# Patient Record
Sex: Female | Born: 1960 | Race: Black or African American | Hispanic: No | Marital: Married | Smoking: Never smoker
Health system: Southern US, Community
[De-identification: ages and names within clinical notes are randomized; demographics above are authoritative.]

## PROBLEM LIST (undated history)

## (undated) DIAGNOSIS — C801 Malignant (primary) neoplasm, unspecified: Secondary | ICD-10-CM

## (undated) HISTORY — PX: TUBAL LIGATION: SHX77

## (undated) HISTORY — PX: LEG SURGERY: SHX1003

## (undated) HISTORY — DX: Malignant (primary) neoplasm, unspecified: C80.1

---

## 1998-11-25 ENCOUNTER — Ambulatory Visit (HOSPITAL_COMMUNITY): Admission: RE | Admit: 1998-11-25 | Discharge: 1998-11-25 | Payer: Self-pay | Admitting: Obstetrics

## 2000-09-23 ENCOUNTER — Other Ambulatory Visit: Admission: RE | Admit: 2000-09-23 | Discharge: 2000-09-23 | Payer: Self-pay | Admitting: Obstetrics

## 2002-06-02 ENCOUNTER — Encounter: Payer: Self-pay | Admitting: Obstetrics

## 2002-06-02 ENCOUNTER — Ambulatory Visit (HOSPITAL_COMMUNITY): Admission: RE | Admit: 2002-06-02 | Discharge: 2002-06-02 | Payer: Self-pay | Admitting: Obstetrics

## 2002-06-19 ENCOUNTER — Encounter: Payer: Self-pay | Admitting: Obstetrics

## 2002-06-19 ENCOUNTER — Encounter: Admission: RE | Admit: 2002-06-19 | Discharge: 2002-06-19 | Payer: Self-pay | Admitting: Obstetrics

## 2003-11-03 ENCOUNTER — Ambulatory Visit (HOSPITAL_COMMUNITY): Admission: RE | Admit: 2003-11-03 | Discharge: 2003-11-03 | Payer: Self-pay | Admitting: Obstetrics

## 2005-01-12 ENCOUNTER — Ambulatory Visit (HOSPITAL_COMMUNITY): Admission: RE | Admit: 2005-01-12 | Discharge: 2005-01-12 | Payer: Self-pay | Admitting: Obstetrics

## 2006-04-10 ENCOUNTER — Ambulatory Visit (HOSPITAL_COMMUNITY): Admission: RE | Admit: 2006-04-10 | Discharge: 2006-04-10 | Payer: Self-pay | Admitting: Obstetrics

## 2007-04-16 ENCOUNTER — Ambulatory Visit (HOSPITAL_COMMUNITY): Admission: RE | Admit: 2007-04-16 | Discharge: 2007-04-16 | Payer: Self-pay | Admitting: Obstetrics

## 2008-05-25 ENCOUNTER — Ambulatory Visit (HOSPITAL_COMMUNITY): Admission: RE | Admit: 2008-05-25 | Discharge: 2008-05-25 | Payer: Self-pay | Admitting: Obstetrics

## 2014-03-15 ENCOUNTER — Ambulatory Visit (INDEPENDENT_AMBULATORY_CARE_PROVIDER_SITE_OTHER): Payer: 59 | Admitting: Internal Medicine

## 2014-03-15 VITALS — BP 122/70 | HR 79 | Temp 98.0°F | Resp 16 | Ht 63.0 in | Wt 157.8 lb

## 2014-03-15 DIAGNOSIS — G5700 Lesion of sciatic nerve, unspecified lower limb: Secondary | ICD-10-CM

## 2014-03-15 DIAGNOSIS — S46819A Strain of other muscles, fascia and tendons at shoulder and upper arm level, unspecified arm, initial encounter: Secondary | ICD-10-CM

## 2014-03-15 MED ORDER — MELOXICAM 15 MG PO TABS
15.0000 mg | ORAL_TABLET | Freq: Every day | ORAL | Status: AC
Start: 2014-03-15 — End: ?

## 2014-03-15 MED ORDER — CYCLOBENZAPRINE HCL 10 MG PO TABS: 10.0000 mg | ORAL_TABLET | Freq: Every day | ORAL | Status: AC

## 2014-03-15 NOTE — Patient Instructions (Signed)
Trapezius Palsy  with Rehab The trapezius is a large muscle of the upper back that helps to control the shoulder blade (scapula) and stabilize the spine. Trapezius palsy is a condition affecting the nervous system in the trapezius muscle. The condition results in pain and weakness in the back of the shoulder and upper back. Shoulder function is also decreased, because the scapula contains the socket for "ball and-socket" joint of the shoulder. Trapezius palsy is caused by injury to the spinal accessory nerve, which connects to the trapezius muscle. SYMPTOMS   Pain that is achy and in the shoulder and/or upper back.  Decreased shoulder function and/ or strength.  Scapula protrudes (winging of the scapula).  Back pain when sitting in a chair with a hard back due to the scapula winging and placing pressure on the back of the chair.  Shrinkage (atrophy) of the trapezius muscle, this may or may not make the neckline look asymmetric.  Shoulder drooping. CAUSES  Trapezius palsy is caused by damage to the spinal accessory nerve, which is connected to the trapesius muscle. This condition is often associated with acromioclavicular [facility]) or sternoclavicular subluxation (adjacent bones becoming out of proper alignment, but the joint surfaces are still touching). Common mechanisms of injury include:  Direct trauma to the shoulder (like being hit with an object or falling on the shoulder).  Complication of previous surgery that causes nerve damage. RISK INCREASES WITH:  Contact sports (ie. football, rugby, or lacrosse).  Surgery around the neck.  Poor strength and flexibility. PREVENTION   Warm up and stretch properly before activity.  Maintain physical fitness:  Strength, flexibility, and endurance.  Cardiovascular fitness. PROGNOSIS  Trapezius palsy usually resolves spontaneously within 3 to 6 months. Non-surgical (conservative) treatments may help decrease the severity of symptoms.    RELATED COMPLICATIONS   Permanent nerve damage, including pain, numbness, tingle, or weakness.  Inability to compete at a high level.  Recurrent symptoms that result in a chronic problem.  Shoulder stiffness. TREATMENT Treatment initially involves resting from any activities that aggravate the symptoms, and the use of ice and medications to help reduce pain and inflammation. The use of strengthening and stretching exercises may help reduce pain with activity. These exercises may be performed at home or with referral to a therapist. A therapist may recommend further treatment, such as:  The use of electric current to simulate the nerves (transcutaneous electronic nerve stimulation (TENS)).  Ultrasound. If symptoms are severe, your caregiver may recommend you wear a brace to decrease discomfort. If symptoms persist for more than 6 months despite non-surgical treatment, surgery may be recommended (uncommon). MEDICATION   If pain medication is necessary, then nonsteroidal anti-inflammatory medications, such as aspirin and ibuprofen, or other minor pain relievers, such as acetaminophen, are often recommended.  Do not take pain medication for 7 days before surgery.  Prescription pain relievers may be given if deemed necessary by your caregiver. Use only as directed and only as much as you need. HEAT AND COLD  Cold treatment (icing) relieves pain and reduces inflammation. Cold treatment should be applied for 10 to 15 minutes every 2 to 3 hours for inflammation and pain and immediately after any activity that aggravates your symptoms. Use ice packs or massage the area with a piece of ice (ice massage).  Heat treatment may be used prior to performing the stretching and strengthening activities prescribed by your caregiver, physical therapist, or athletic trainer. Use a heat pack or soak your injury in warm water.  SEEK MEDICAL CARE IF:  Treatment seems to offer no benefit, or the condition  worsens.  Any medications produce adverse side effects. EXERCISES RANGE OF MOTION (ROM) AND STRETCHING EXERCISES - Trapezius Palsy (Spinal Accessory Nerve Palsy) These exercises may help you when beginning to rehabilitate your injury. Your symptoms may resolve with or without further involvement from your physician, physical therapist or athletic trainer. While completing these exercises, remember:   Restoring tissue flexibility helps normal motion to return to the joints. This allows healthier, less painful movement and activity.  An effective stretch should be held for at least 30 seconds.  A stretch should never be painful. You should only feel a gentle lengthening or release in the stretched tissue. STRETCH  Flexion, Standing  Stand with good posture. With an underhand grip on your right / left and an overhand grip on the opposite hand, grasp a broomstick or cane so that your hands are a little more than shoulder-width apart.  Keeping your right / left elbow straight and shoulder muscles relaxed, push the stick with your opposite hand to raise your right / left arm in front of your body and then overhead. Raise your arm until you feel a stretch in your right / left shoulder, but before you have increased shoulder pain.  Try to avoid shrugging your right / left shoulder as your arm rises by keeping your shoulder blade tucked down and toward your mid-back spine. Hold __________ seconds.  Slowly return to the starting position. Repeat __________ times. Complete this exercise __________ times per day.  STRETCH  Abduction, Supine  Stand with good posture. With an underhand grip on your right / left and an overhand grip on the opposite hand, grasp a broomstick or cane so that your hands are a little more than shoulder-width apart.  Keeping your right / left elbow straight and shoulder muscles relaxed, push the stick with your opposite hand to raise your right / left arm out to the side of  your body and then overhead. Raise your arm until you feel a stretch in your right / left shoulder, but before you have increased shoulder pain.  Try to avoid shrugging your right / left shoulder as your arm rises by keeping your shoulder blade tucked down and toward your mid-back spine. Hold __________ seconds.  Slowly return to the starting position. Repeat __________ times. Complete this exercise __________ times per day.  ROM  Flexion, Active-Assisted  Lie on your back. You may bend your knees for comfort.  Grasp a broomstick or cane so your hands are about shoulder-width apart. Your right / left hand should grip the end of the stick/cane so that your hand is positioned "thumbs-up," as if you were about to shake hands.  Using your healthy arm to lead, raise your right / left arm overhead until you feel a gentle stretch in your shoulder. Hold __________ seconds.  Use the stick/cane to assist in returning your right / left arm to its starting position. Repeat __________ times. Complete this exercise __________ times per day.  STRETCH - External Rotation and Abduction  Stagger your stance through a doorframe. It does not matter which foot is forward.  Choose one of the following positions as instructed by your physician, physical therapist or athletic trainer: place your hands:  and forearms above your head and on the door frame.  and forearms at head-height and on the door frame.  at elbow-height and on the door frame.  Keeping your head and  chest upright and your stomach muscles tight to prevent over-extending your low-back, slowly shift your weight onto your front foot until you feel a stretch across your chest and/or in the front of your shoulders.  Hold __________ seconds. Shift your weight to your back foot to release the stretch. Repeat __________ times. Complete this stretch __________ times per day.  STRENGTHENING EXERCISES - Trapezius Palsy (Spinal Accessory Nerve  Palsy) These exercises may help you when beginning to rehabilitate your injury. They may resolve your symptoms with or without further involvement from your physician, physical therapist or athletic trainer. While completing these exercises, remember:   Muscles can gain both the endurance and the strength needed for everyday activities through controlled exercises.  Complete these exercises as instructed by your physician, physical therapist or athletic trainer. Progress with the resistance and repetition exercises only as your caregiver advises.  You may experience muscle soreness or fatigue, but the pain or discomfort you are trying to eliminate should never worsen during these exercises. If this pain does worsen, stop and make certain you are following the directions exactly. If the pain is still present after adjustments, discontinue the exercise until you can discuss the trouble with your clinician. STRENGTH - Scapular Depression and Adduction  With good posture, sit on a firm chair. Support your arms in front of you with pillows, arm rests or a table top. Have your elbows in line with the sides of your body.  Gently draw your shoulder blades down and toward your mid-back spine. Gradually increase the tension without tensing the muscles along the top of your shoulders and the back of your neck.  Hold for __________ seconds. Slowly release the tension and relax your muscles completely before completing the next repetition.  After you have practiced this exercise, remove the arm support and complete it while standing as well as sitting. Repeat __________ times. Complete this exercise __________ times per day.  STRENGTH - Shoulder Abductors, Isometric   With good posture, stand or sit about 4-6 inches from a wall with your right / left side facing the wall.  Bend your right / left elbow. Gently press your right / left elbow into the wall. Increase the pressure gradually until you are pressing  as hard as you can without shrugging your shoulder or increasing any shoulder discomfort.  Hold __________ seconds.  Release the tension slowly. Relax your shoulder muscles completely before you do the next repetition. Repeat __________ times. Complete this exercise __________ times per day.  STRENGTH - Shoulder Flexion, Isometric  With good posture and facing a wall, stand or sit about 4-6 inches away.  Keeping your right / left elbow straight, gently press the top of your fist into the wall. Increase the pressure gradually until you are pressing as hard as you can without shrugging your shoulder or increasing any shoulder discomfort.  Hold __________ seconds.  Release the tension slowly. Relax your shoulder muscles completely before you do the next repetition. Repeat __________ times. Complete this exercise __________ times per day.  STRENGTH - Internal Rotators  Secure a rubber exercise band/tubing to a fixed object so that it is at the same height as your right / left elbow when you are standing or sitting on a firm surface.  Stand or sit so that the secured exercise band/tubing is at your right / left side.  Bend your elbow 90 degrees. Place a folded towel or small pillow under your right / left arm so that your elbow is  a few inches away from your side.  Keeping the tension on the exercise band/tubing, pull it across your body toward your abdomen. Be sure to keep your body steady so that the movement is only coming from your shoulder rotating.  Hold __________ seconds. Release the tension in a controlled manner as you return to the starting position. Repeat __________ times. Complete this exercise __________ times per day.  STRENGTH - External Rotators  Secure a rubber exercise band/tubing to a fixed object so that it is at the same height as your right / left elbow when you are standing or sitting on a firm surface.  Stand or sit so that the secured exercise band/tubing is at  your side that is not injured.  Bend your elbow 90 degrees. Place a folded towel or small pillow under your right / left arm so that your elbow is a few inches away from your side.  Keeping the tension on the exercise band/tubing, pull it away from your body, as if pivoting on your elbow. Be sure to keep your body steady so that the movement is only coming from your shoulder rotating.  Hold __________ seconds. Release the tension in a controlled manner as you return to the starting position. Repeat __________ times. Complete this exercise __________ times per day.  STRENGTH - Shoulder Extensors  Secure a rubber exercise band/tubing so that it is at the height of your shoulders when you are either standing or sitting on a firm arm-less chair.  With a thumbs-up grip, grasp an end of the band/tubing in each hand. Straighten your elbows and lift your hands straight in front of you at shoulder height. Step back away from the secured end of band/tubing until it becomes tense.  Squeezing your shoulder blades together, pull your hands down to the sides of your thighs. Do not allow your hands to go behind you.  Hold for __________ seconds. Slowly ease the tension on the band/tubing as you reverse the directions and return to the starting position. Repeat __________ times. Complete this exercise __________ times per day.  STRENGTH - Shoulder Extensors, Prone  Lie on your stomach on a firm surface so that your right / left arm overhangs the edge. Rest your forehead on your opposite forearm. With your thumb facing away from your body and your elbow straight, hold a __________ weight in your hand.  Squeeze your right / left shoulder blade to your mid-back spine and then slowly raise your arm behind you to the height of the bed.  Hold for __________ seconds. Slowly reverse the directions and return to the starting position, controlling the weight as you lower your arm. Repeat __________ times. Complete  this exercise __________ times per day.  STRENGTH - Horizontal Abductors Choose one of the two oppositions to complete this exercise. Prone (lying on stomach):  Lie on your stomach on a firm surface so that your right / left arm overhangs the edge. Rest your forehead on your opposite forearm. With your palm facing the floor and your elbow straight, hold a __________ weight in your hand.  Squeeze your right / left shoulder blade to your mid-back spine and then slowly raise your arm to the height of the bed.  Hold for __________ seconds. Slowly reverse the directions and return to the starting position, controlling the weight as you lower your arm. Repeat __________ times. Complete this exercise __________ times per day. Standing:   Secure a rubber exercise band/tubing so that it is at  the height of your shoulders when you are either standing or sitting on a firm arm-less chair.  Grasp an end of the band/tubing in each hand and have your palms face each other. Straighten your elbows and lift your hands straight in front of you at shoulder height. Step back away from the secured end of band/tubing until it becomes tense.  Squeeze your shoulder blades together. Keeping your elbows locked and your hands at shoulder-height, bring your hands out to your side.  Hold __________ seconds. Slowly ease the tension on the band/tubing as you reverse the directions and return to the starting position. Repeat __________ times. Complete this exercise __________ times per day. STRENGTH - Scapular Retractors and Elevators  Secure a rubber exercise band/tubing so that it is at the height of your shoulders when you are either standing or sitting on a firm arm-less chair.  With a thumbs-up grip, grasp an end of the band/tubing in each hand. Step back away from the secured end of band/tubing until it becomes tense.  Squeezing your shoulder blades together, straighten your elbows and lift your hands straight over  your head.  Hold for __________ seconds. Slowly ease the tension on the band/tubing as you reverse the directions and return to the starting position. Repeat __________ times. Complete this exercise __________ times per day.  Document Released: 11/05/2005 Document Revised: 01/28/2012 Document Reviewed: 02/17/2009 [facility] Patient Information 2014 [place], [place].  Piriformis Syndrome with Rehab Piriformis syndrome is a condition the affects the nervous system in the area of the hip, and is characterized by pain and possibly a loss of feeling in the backside (posterior) thigh that may extend down the entire length of the leg. The symptoms are caused by an increase in pressure on the sciatic nerve by the piriformis muscle, which is on the back of the hip and is responsible for externally rotating the hip. The sciatic nerve and its branches connect to much of the leg. Normally the sciatic nerve runs between the piriformis muscle and other muscles. However, in certain individuals the nerve runs through the muscle, which causes an increase in pressure on the nerve and results in the symptoms of piriformis syndrome. SYMPTOMS   Pain, tingling, numbness, or burning in the back of the thigh that may also extend down the entire leg.  Occasionally, tenderness in the buttock.  Loss of function of the leg.  Pain that worsens when using the piriformis muscle (running, jumping, or stairs).  Pain that increases with prolonged sitting.  Pain that is lessened by laying flat on the back. CAUSES   Piriformis syndrome is the result of an increase in pressure placed on the sciatic nerve. Often times piriformis syndrome is an overuse injury.  Stress placed on the nerve from a sudden increase in the intensity, frequency, or duration of training.  Compensation of other extremity injuries. RISK INCREASES WITH:  Sports that involve the piriformis muscle (running, walking or jumping).  You are born with  (congenital) a defect in which the sciatic nerve passes through the muscle. PREVENTION  Warm up and stretch properly before activity.  Allow for adequate recovery between workouts.  Maintain physical fitness:  Strength, flexibility, and endurance.  Cardiovascular fitness. PROGNOSIS  If treated properly, then the symptoms of piriformis syndrome usually resolve in 2 to 6 weeks. RELATED COMPLICATIONS   Persistent and possibly permanent pain and numbness in the lower extremity.  Weakness of the extremity that may progress to disability and inability to compete. TREATMENT  The most effective treatment for piriformis syndrome is rest from any activities that aggravate the symptoms. Ice and pain medication may help reduce pain and inflammation. The use of strengthening and stretching exercises may help reduce pain with activity. These exercises may be performed at home or with a therapist. A referral to a therapist may be given for further evaluation and treatment, such as ultrasound. Corticosteroid injections may be given to reduce inflammation that is causing pressure to be placed on the sciatic nerve. If non-surgical (conservative) treatment is unsuccessful, then surgery may be recommended.  MEDICATION   If pain medication is necessary, then nonsteroidal anti-inflammatory medications, such as aspirin and ibuprofen, or other minor pain relievers, such as acetaminophen, are often recommended.  Do not take pain medication for 7 days before surgery.  Prescription pain relievers may be given if deemed necessary by your caregiver. Use only as directed and only as much as you need.  Corticosteroid injections may be given by your caregiver. These injections should be reserved for the most serious cases, because they may only be given a certain number of times. HEAT AND COLD:   Cold treatment (icing) relieves pain and reduces inflammation. Cold treatment should be applied for 10 to 15 minutes  every 2 to 3 hours for inflammation and pain and immediately after any activity that aggravates your symptoms. Use ice packs or massage the area with a piece of ice (ice massage).  Heat treatment may be used prior to performing the stretching and strengthening activities prescribed by your caregiver, physical therapist, or athletic trainer. Use a heat pack or soak the injury in warm water. SEEK IMMEDIATE MEDICAL CARE IF:  Treatment seems to offer no benefit, or the condition worsens.  Any medications produce adverse side effects. EXERCISES RANGE OF MOTION (ROM) AND STRETCHING EXERCISES - Piriformis Syndrome These exercises may help you when beginning to rehabilitate your injury. Your symptoms may resolve with or without further involvement from your physician, physical therapist or athletic trainer. While completing these exercises, remember:   Restoring tissue flexibility helps normal motion to return to the joints. This allows healthier, less painful movement and activity.  An effective stretch should be held for at least 30 seconds.  A stretch should never be painful. You should only feel a gentle lengthening or release in the stretched tissue. STRETCH - Hip Rotators  Lie on your back on a firm surface. Grasp your right / left knee with your right / left hand and your ankle with your opposite hand.  Keeping your hips and shoulders firmly planted, gently pull your right / left knee and rotate your lower leg toward your opposite shoulder until you feel a stretch in your buttocks.  Hold this stretch for __________ seconds. Repeat this stretch __________ times. Complete this stretch __________ times per day. STRETCH  Iliotibial Band  On the floor or bed, lie on your side so your right / left leg is on top. Bend your knee and grab your ankle.  Slowly bring your knee back so that your thigh is in line with your trunk. Keep your heel at your buttocks and gently arch your back so your head,  shoulders and hips line up.  Slowly lower your leg so that your knee approaches the floor/bed until you feel a gentle stretch on the outside of your right / left thigh. If you do not feel a stretch and your knee will not fall farther, place the heel of your opposite foot on top of  your knee and pull your thigh down farther.  Hold this stretch for __________ seconds. Repeat __________ times. Complete __________ times per day. STRENGTHENING EXERCISES - Piriformis Syndrome  These are some of the caregiver again or until your symptoms are resolved. Remember:   Strong muscles with good endurance tolerate stress better.  Do the exercises as initially prescribed by your caregiver. Progress slowly with each exercise, gradually increasing the number of repetitions and weight used under their guidance. STRENGTH - Hip Abductors, Straight Leg Raises Be aware of your form throughout the entire exercise so that you exercise the correct muscles. Sloppy form means that you are not strengthening the correct muscles.  Lie on your side so that your head, shoulders, knee and hip line up. You may bend your lower knee to help maintain your balance. Your right / left leg should be on top.  Roll your hips slightly forward, so that your hips are stacked directly over each other and your right / left knee is facing forward.  Lift your top leg up 4-6 inches, leading with your heel. Be sure that your foot does not drift forward or that your knee does not roll toward the ceiling.  Hold this position for __________ seconds. You should feel the muscles in your outer hip lifting (you may not notice this until your leg begins to tire).  Slowly lower your leg to the starting position. Allow the muscles to fully relax before beginning the next repetition. Repeat __________ times. Complete this exercise __________ times per day.  STRENGTH - Hip Abductors, Quadriped  On a firm, lightly padded surface, position yourself on  your hands and knees. Your hands should be directly below your shoulders and your knees should be directly below your hips.  Keeping your right / left knee bent, lift your leg out to the side. Keep your legs level and in line with your shoulders.  Position yourself on your hands and knees.  Hold for __________ seconds.  Keeping your trunk steady and your hips level, slowly lower your leg to the starting position. Repeat __________ times. Complete this exercise __________ times per day.  STRENGTH - Hip Abductors, Standing  Tie one end of a rubber exercise band/tubing to a secure surface (table, pole) and tie a loop at the other end.  Place the loop around your right / left ankle. Keeping your ankle with the band directly opposite of the secured end, step away until there is tension in the tube/band.  Hold onto a chair as needed for balance.  Keeping your back upright, your shoulders over your hips, and your toes pointing forward, lift your right / left leg out to your side. Be sure to lift your leg with your hip muscles. Do not "throw" your leg or tip your body to lift your leg.  Slowly and with control, return to the starting position. Repeat exercise __________ times. Complete this exercise __________ times per day.  Document Released: 11/05/2005 Document Revised: 05/06/2012 Document Reviewed: 02/17/2009 [facility] Patient Information 2014 [place], [place].
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
## 2014-04-24 ENCOUNTER — Other Ambulatory Visit: Payer: Self-pay | Admitting: Internal Medicine

## 2014-04-24 ENCOUNTER — Ambulatory Visit: Payer: 59

## 2014-04-24 ENCOUNTER — Ambulatory Visit (INDEPENDENT_AMBULATORY_CARE_PROVIDER_SITE_OTHER): Payer: 59 | Admitting: Internal Medicine

## 2014-04-24 VITALS — BP 124/66 | HR 101 | Temp 98.0°F | Resp 16 | Ht 61.0 in | Wt 158.8 lb

## 2014-04-24 DIAGNOSIS — M25552 Pain in left hip: Secondary | ICD-10-CM

## 2014-04-24 DIAGNOSIS — R202 Paresthesia of skin: Secondary | ICD-10-CM

## 2014-04-24 DIAGNOSIS — M545 Low back pain, unspecified: Secondary | ICD-10-CM

## 2014-04-24 DIAGNOSIS — R209 Unspecified disturbances of skin sensation: Secondary | ICD-10-CM

## 2014-04-24 DIAGNOSIS — M25551 Pain in right hip: Secondary | ICD-10-CM

## 2014-04-24 DIAGNOSIS — M25559 Pain in unspecified hip: Secondary | ICD-10-CM

## 2014-04-24 MED ORDER — IBUPROFEN 600 MG PO TABS: 600.0000 mg | ORAL_TABLET | Freq: Three times a day (TID) | ORAL | Status: AC | PRN

## 2014-04-24 MED ORDER — METHOCARBAMOL 750 MG PO TABS: 750.0000 mg | ORAL_TABLET | Freq: Four times a day (QID) | ORAL | Status: AC
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
## 2014-06-24 DIAGNOSIS — Z0271 Encounter for disability determination: Secondary | ICD-10-CM

## 2015-04-22 ENCOUNTER — Other Ambulatory Visit (HOSPITAL_COMMUNITY): Payer: Self-pay | Admitting: Obstetrics

## 2015-04-22 DIAGNOSIS — Z1231 Encounter for screening mammogram for malignant neoplasm of breast: Secondary | ICD-10-CM

## 2015-05-09 ENCOUNTER — Ambulatory Visit (HOSPITAL_COMMUNITY)
Admission: RE | Admit: 2015-05-09 | Discharge: 2015-05-09 | Disposition: A | Discharge status: Home / Self Care | Payer: 59 | Source: Ambulatory Visit | Attending: Obstetrics | Admitting: Obstetrics

## 2015-05-09 DIAGNOSIS — Z1231 Encounter for screening mammogram for malignant neoplasm of breast: Secondary | ICD-10-CM | POA: Insufficient documentation

## 2016-08-13 ENCOUNTER — Encounter: Payer: Self-pay | Admitting: *Deleted

## 2017-04-23 DIAGNOSIS — Z01419 Encounter for gynecological examination (general) (routine) without abnormal findings: Secondary | ICD-10-CM | POA: Diagnosis not present

## 2017-05-07 DIAGNOSIS — Z1329 Encounter for screening for other suspected endocrine disorder: Secondary | ICD-10-CM | POA: Diagnosis not present

## 2017-05-07 DIAGNOSIS — Z13220 Encounter for screening for lipoid disorders: Secondary | ICD-10-CM | POA: Diagnosis not present

## 2017-05-07 DIAGNOSIS — Z131 Encounter for screening for diabetes mellitus: Secondary | ICD-10-CM | POA: Diagnosis not present

## 2017-05-20 DIAGNOSIS — M1711 Unilateral primary osteoarthritis, right knee: Secondary | ICD-10-CM | POA: Diagnosis not present

## 2017-05-20 DIAGNOSIS — Z23 Encounter for immunization: Secondary | ICD-10-CM | POA: Diagnosis not present

## 2017-05-20 DIAGNOSIS — E782 Mixed hyperlipidemia: Secondary | ICD-10-CM | POA: Diagnosis not present

## 2017-06-24 ENCOUNTER — Ambulatory Visit
Admission: RE | Admit: 2017-06-24 | Discharge: 2017-06-24 | Disposition: A | Discharge status: Home / Self Care | Payer: 59 | Source: Ambulatory Visit | Attending: Family Medicine | Admitting: Family Medicine

## 2017-06-24 ENCOUNTER — Other Ambulatory Visit: Payer: Self-pay | Admitting: Family Medicine

## 2017-06-24 DIAGNOSIS — M1712 Unilateral primary osteoarthritis, left knee: Secondary | ICD-10-CM

## 2017-06-24 DIAGNOSIS — E782 Mixed hyperlipidemia: Secondary | ICD-10-CM | POA: Diagnosis not present

## 2017-06-24 DIAGNOSIS — M1711 Unilateral primary osteoarthritis, right knee: Secondary | ICD-10-CM | POA: Diagnosis not present

## 2017-06-26 DIAGNOSIS — E782 Mixed hyperlipidemia: Secondary | ICD-10-CM | POA: Diagnosis not present

## 2017-06-26 DIAGNOSIS — M1711 Unilateral primary osteoarthritis, right knee: Secondary | ICD-10-CM | POA: Diagnosis not present

## 2017-09-26 DIAGNOSIS — Z0000 Encounter for general adult medical examination without abnormal findings: Secondary | ICD-10-CM | POA: Diagnosis not present

## 2017-09-30 DIAGNOSIS — Z1211 Encounter for screening for malignant neoplasm of colon: Secondary | ICD-10-CM | POA: Diagnosis not present

## 2017-09-30 DIAGNOSIS — Z0000 Encounter for general adult medical examination without abnormal findings: Secondary | ICD-10-CM | POA: Diagnosis not present

## 2017-09-30 DIAGNOSIS — E782 Mixed hyperlipidemia: Secondary | ICD-10-CM | POA: Diagnosis not present

## 2018-03-04 DIAGNOSIS — E782 Mixed hyperlipidemia: Secondary | ICD-10-CM | POA: Diagnosis not present

## 2018-03-06 DIAGNOSIS — M1711 Unilateral primary osteoarthritis, right knee: Secondary | ICD-10-CM | POA: Diagnosis not present

## 2018-03-06 DIAGNOSIS — E782 Mixed hyperlipidemia: Secondary | ICD-10-CM | POA: Diagnosis not present

## 2018-10-01 DIAGNOSIS — Z0000 Encounter for general adult medical examination without abnormal findings: Secondary | ICD-10-CM | POA: Diagnosis not present

## 2018-10-03 DIAGNOSIS — Z0000 Encounter for general adult medical examination without abnormal findings: Secondary | ICD-10-CM | POA: Diagnosis not present

## 2018-10-03 DIAGNOSIS — Z6832 Body mass index (BMI) 32.0-32.9, adult: Secondary | ICD-10-CM | POA: Diagnosis not present

## 2018-10-03 DIAGNOSIS — E782 Mixed hyperlipidemia: Secondary | ICD-10-CM | POA: Diagnosis not present

## 2018-10-03 DIAGNOSIS — Z1211 Encounter for screening for malignant neoplasm of colon: Secondary | ICD-10-CM | POA: Diagnosis not present

## 2018-10-06 ENCOUNTER — Other Ambulatory Visit: Payer: Self-pay | Admitting: Family Medicine

## 2018-10-06 DIAGNOSIS — Z1231 Encounter for screening mammogram for malignant neoplasm of breast: Secondary | ICD-10-CM

## 2018-11-21 ENCOUNTER — Ambulatory Visit
Admission: RE | Admit: 2018-11-21 | Discharge: 2018-11-21 | Disposition: A | Discharge status: Home / Self Care | Payer: 59 | Source: Ambulatory Visit | Attending: Family Medicine | Admitting: Family Medicine

## 2018-11-21 DIAGNOSIS — Z1231 Encounter for screening mammogram for malignant neoplasm of breast: Secondary | ICD-10-CM | POA: Diagnosis not present

## 2019-03-31 DIAGNOSIS — E782 Mixed hyperlipidemia: Secondary | ICD-10-CM | POA: Diagnosis not present

## 2019-04-03 DIAGNOSIS — E663 Overweight: Secondary | ICD-10-CM | POA: Diagnosis not present

## 2019-04-03 DIAGNOSIS — R030 Elevated blood-pressure reading, without diagnosis of hypertension: Secondary | ICD-10-CM | POA: Diagnosis not present

## 2019-04-03 DIAGNOSIS — E782 Mixed hyperlipidemia: Secondary | ICD-10-CM | POA: Diagnosis not present

## 2020-10-21 ENCOUNTER — Other Ambulatory Visit: Payer: Self-pay | Admitting: Family Medicine

## 2020-10-21 DIAGNOSIS — Z1231 Encounter for screening mammogram for malignant neoplasm of breast: Secondary | ICD-10-CM

## 2020-12-13 ENCOUNTER — Ambulatory Visit
Admission: RE | Admit: 2020-12-13 | Discharge: 2020-12-13 | Disposition: A | Discharge status: Home / Self Care | Payer: 59 | Source: Ambulatory Visit | Attending: Family Medicine | Admitting: Family Medicine

## 2020-12-13 ENCOUNTER — Other Ambulatory Visit: Payer: Self-pay

## 2020-12-13 DIAGNOSIS — Z1231 Encounter for screening mammogram for malignant neoplasm of breast: Secondary | ICD-10-CM

## 2022-05-26 ENCOUNTER — Encounter: Payer: Self-pay | Admitting: *Deleted

## 2022-08-06 IMAGING — MG MM DIGITAL SCREENING BILAT W/ TOMO AND CAD
8 series · 8 of 24 positions shown · non-contrast
Comparison: Previous exam(s).

CLINICAL DATA: Screening.

EXAM:
DIGITAL SCREENING BILATERAL MAMMOGRAM WITH TOMO AND CAD

[L MLO synth-2D]
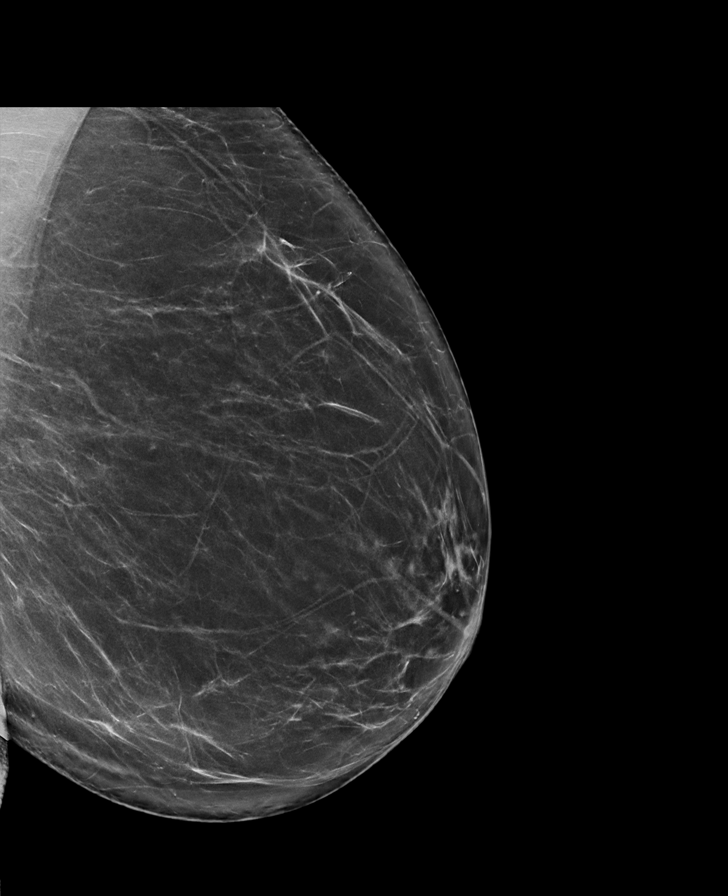

[R CC synth-2D]
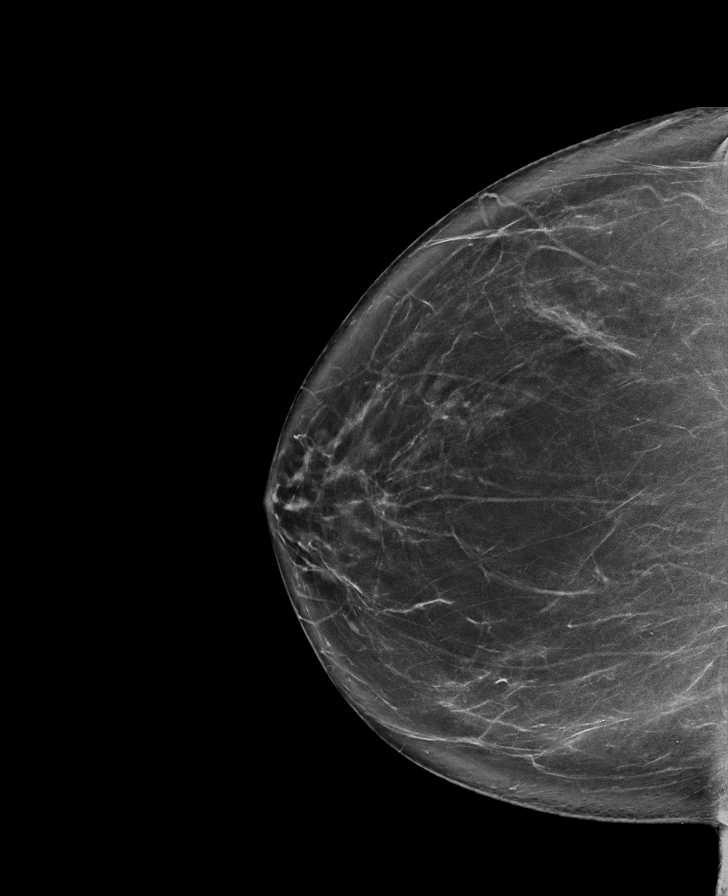

[L CC synth-2D]
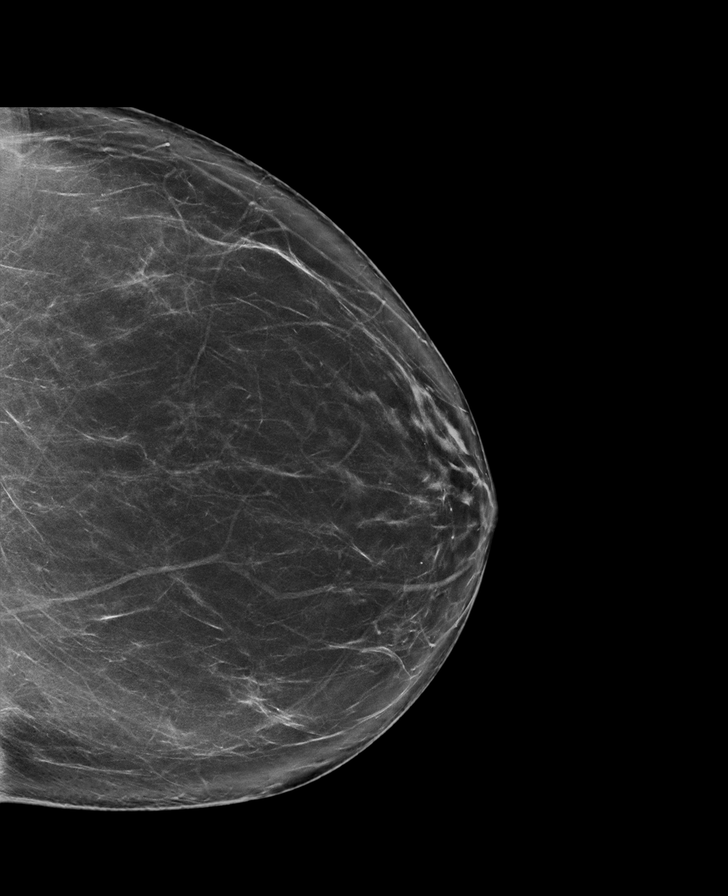

[R MLO synth-2D]
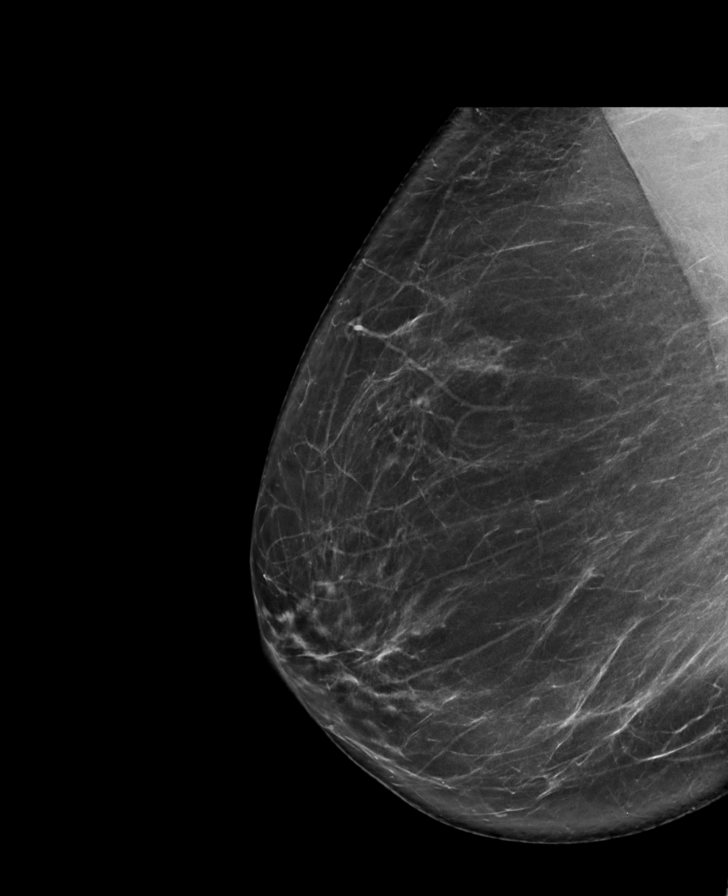

[R MLO tomo · tomo slice 44/87.0]
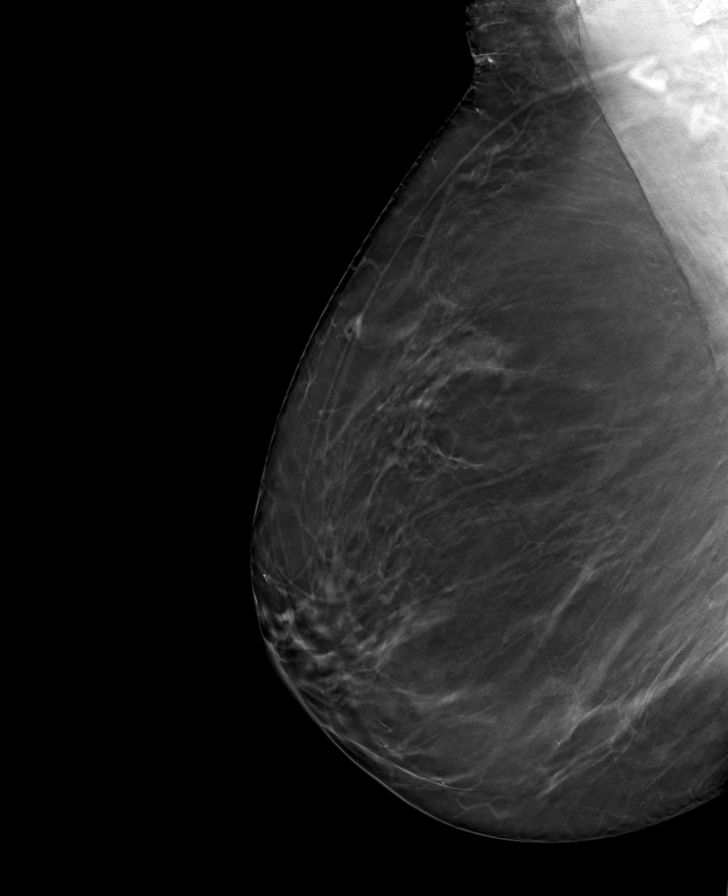

[R CC tomo · tomo slice 45/88.0]
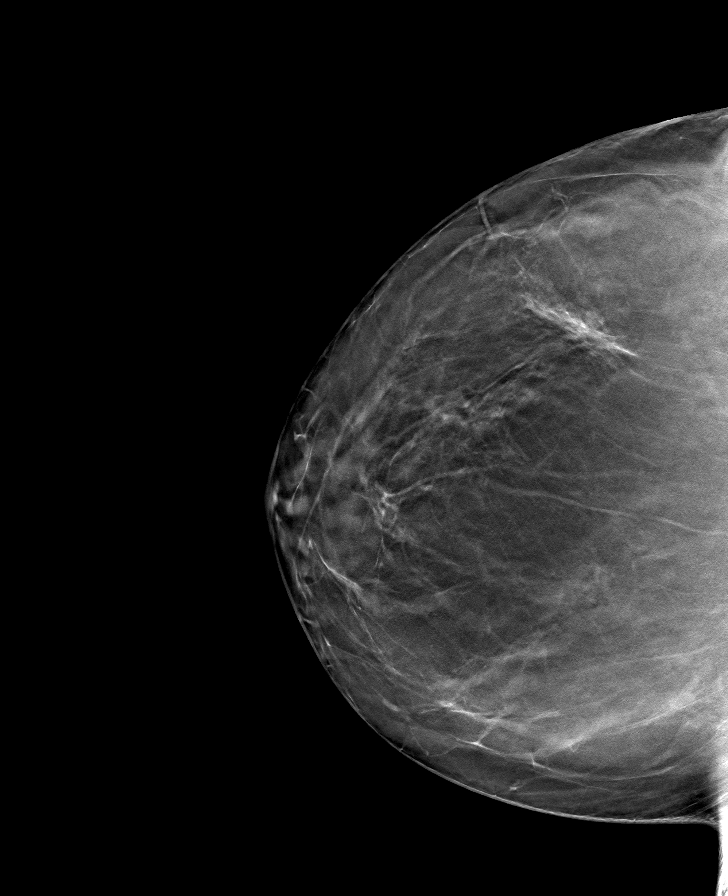

[L CC tomo · tomo slice 43/85.0]
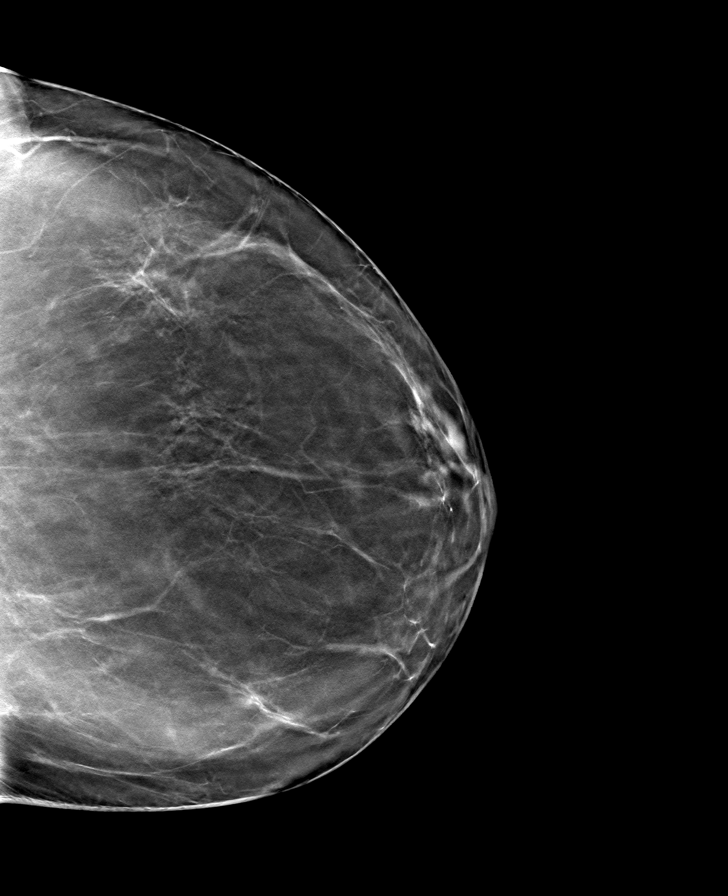

[L MLO tomo · tomo slice 43/84.0]
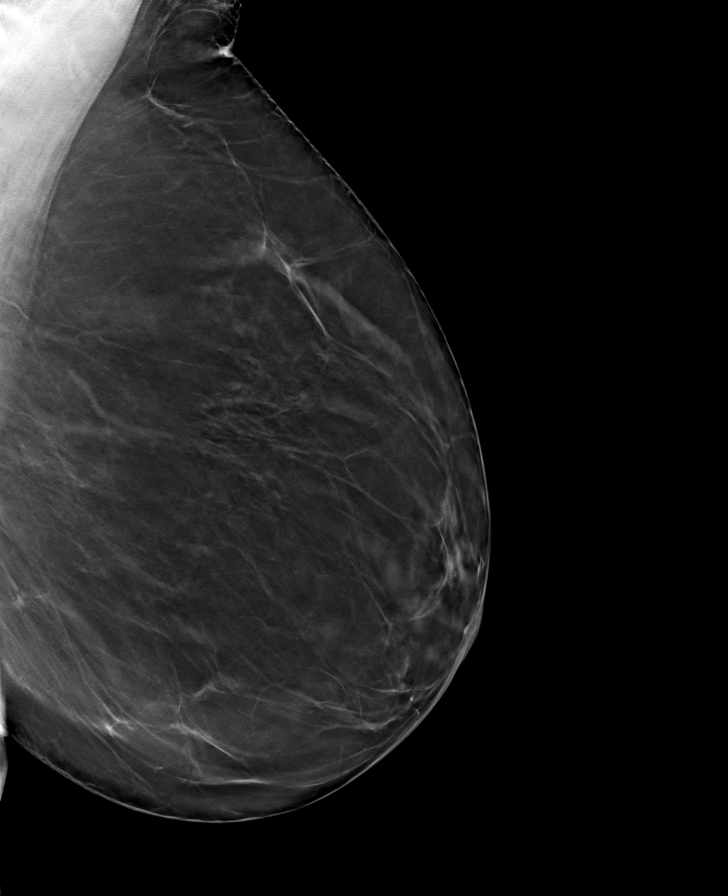

[8 of 24 positions shown; findings below may reference images not displayed]

ACR Breast Density Category b: There are scattered areas of
fibroglandular density.
FINDINGS: There are no findings suspicious for malignancy. The images were
evaluated with computer-aided detection.
IMPRESSION: No mammographic evidence of malignancy. A result letter of this
screening mammogram will be mailed directly to the patient.

RECOMMENDATION:
Screening mammogram in one year. (Code:ZP-7-VX7)

BI-RADS CATEGORY  1: Negative.

## 2022-10-04 ENCOUNTER — Encounter: Payer: Self-pay | Admitting: *Deleted

## 2022-10-05 NOTE — Progress Notes (Signed)
Unable to reach pt by phone x2 - called reported PCP office, Dr. Rachell Cipro - office confirmed Dr. Ernie Hew is pt's PCP, and pt was seen 03/29/22 and has CPX scheduled for 12/23. No further [employer] support indicated at this time.
